# Patient Record
Sex: Male | Born: 1975 | Race: Black or African American | Hispanic: No | Marital: Single | Smoking: Former smoker
Health system: Southern US, Community
[De-identification: ages and names within clinical notes are randomized; demographics above are authoritative.]

## PROBLEM LIST (undated history)

## (undated) DIAGNOSIS — K219 Gastro-esophageal reflux disease without esophagitis: Secondary | ICD-10-CM

## (undated) DIAGNOSIS — Q6652 Congenital pes planus, left foot: Secondary | ICD-10-CM

## (undated) DIAGNOSIS — I10 Essential (primary) hypertension: Secondary | ICD-10-CM

## (undated) DIAGNOSIS — G5600 Carpal tunnel syndrome, unspecified upper limb: Secondary | ICD-10-CM

---

## 2020-07-05 DIAGNOSIS — S83249A Other tear of medial meniscus, current injury, unspecified knee, initial encounter: Secondary | ICD-10-CM | POA: Insufficient documentation

## 2020-09-15 ENCOUNTER — Other Ambulatory Visit: Payer: Self-pay

## 2020-09-15 ENCOUNTER — Emergency Department (HOSPITAL_COMMUNITY)
Admission: EM | Admit: 2020-09-15 | Discharge: 2020-09-15 | Disposition: A | Discharge status: Home / Self Care | Attending: Emergency Medicine | Admitting: Emergency Medicine

## 2020-09-15 ENCOUNTER — Encounter (HOSPITAL_COMMUNITY): Payer: Self-pay | Admitting: Emergency Medicine

## 2020-09-15 ENCOUNTER — Emergency Department (HOSPITAL_COMMUNITY)

## 2020-09-15 DIAGNOSIS — M25532 Pain in left wrist: Secondary | ICD-10-CM | POA: Insufficient documentation

## 2020-09-15 DIAGNOSIS — Z87891 Personal history of nicotine dependence: Secondary | ICD-10-CM | POA: Diagnosis not present

## 2020-09-15 MED ORDER — DICLOFENAC SODIUM 1 % EX GEL: 2.0000 g | Freq: Four times a day (QID) | CUTANEOUS | 0 refills | Status: DC

## 2020-09-15 NOTE — Discharge Instructions (Addendum)
Take Tylenol and Ibuprofen for pain.  May use Voltaren gel to wrist  Follow up with Orthopedics given symptoms symptoms for 9 months

## 2020-09-15 NOTE — ED Triage Notes (Signed)
Patient c/o left wrist pain x 9 months that is progressively getting worse. Denies any known injury or swelling. Denies taking anything for pain.

## 2020-09-15 NOTE — ED Notes (Signed)
Patient denies pain and is resting comfortably.  

## 2020-09-15 NOTE — ED Notes (Signed)
Wrist brace applied to left wrist. No complaints noted from pt

## 2020-09-15 NOTE — ED Provider Notes (Signed)
[facility] EMERGENCY DEPARTMENT Provider Note   CSN: [id number] Arrival date & time: 09/15/20  1304     History Chief Complaint  Patient presents with  . Wrist Pain    Erik Torres is a 44 y.o. male with no significant past medical history who presents for evaluation of wrist pain.  Patient states he has had left-sided wrist pain for last 9 months.  Constant in nature.  Worse with movement.  Does do repetitive motions.  He is currently incarcerated.  Denies fever, chills, nausea, vomiting, chest pain, shortness of breath, paresthesias, weakness, redness, swelling, warmth to extremities.  He has not take anything for his symptoms.  He denies additional aggravating or alleviating factors.  Rates his pain a 5/10.  Now chest pain, shortness of breath, hemoptysis.   History obtained from patient and past medical records.  No interpreter is used.  HPI     History reviewed. No pertinent past medical history.  There are no problems to display for this patient.   History reviewed. No pertinent surgical history.     History reviewed. No pertinent family history.  Social History   Tobacco Use  . Smoking status: Former Smoker    Packs/day: 0.50    Years: 10.00    Pack years: 5.00    Types: Cigarettes  . Smokeless tobacco: Never Used  Vaping Use  . Vaping Use: Never used  Substance Use Topics  . Alcohol use: Never  . Drug use: Never    Home Medications Prior to Admission medications   Medication Sig Start Date End Date Taking? Authorizing Provider  diclofenac Sodium (VOLTAREN) 1 % GEL Apply 2 g topically 4 (four) times daily. 09/15/20   Leyah Bocchino A, PA-C    Allergies    Patient has no known allergies.  Review of Systems   Review of Systems  Constitutional: Negative.   HENT: Negative.   Respiratory: Negative.   Cardiovascular: Negative.   Gastrointestinal: Negative.   Genitourinary: Negative.   Musculoskeletal:       Left wrist pain  Neurological:  Negative.   All other systems reviewed and are negative.  Physical Exam Updated Vital Signs BP (!) 179/102 (BP Location: Right Arm)   Pulse 64   Temp 98.4 F (36.9 C) (Oral)   Resp 17   Ht 5\' 9"  (1.753 m)   Wt 86.2 kg   SpO2 100%   BMI 28.06 kg/m   Physical Exam Vitals and nursing note reviewed.  Constitutional:      General: He is not in acute distress.    Appearance: He is well-developed. He is not ill-appearing, toxic-appearing or diaphoretic.  HENT:     Head: Normocephalic and atraumatic.     Nose: Nose normal.     Mouth/Throat:     Mouth: Mucous membranes are moist.  Eyes:     Pupils: Pupils are equal, round, and reactive to light.  Cardiovascular:     Rate and Rhythm: Normal rate and regular rhythm.     Pulses: Normal pulses.     Heart sounds: Normal heart sounds.  Pulmonary:     Effort: Pulmonary effort is normal. No respiratory distress.     Breath sounds: Normal breath sounds.  Abdominal:     General: Bowel sounds are normal. There is no distension.     Palpations: Abdomen is soft.     Tenderness: There is no abdominal tenderness. There is no right CVA tenderness, left CVA tenderness or guarding.  Musculoskeletal:  General: No swelling, tenderness, deformity or signs of injury. Normal range of motion.     Cervical back: Normal range of motion and neck supple.     Right lower leg: No edema.     Left lower leg: No edema.     Comments: Mild tenderness to left breast.  Able to flex and extend, pronate and supinate without difficulty.  No bony tenderness to scaphoid.  Equal handgrip bilaterally.  No bony tenderness to radius or ulna.  No overlying skin changes.  Skin:    General: Skin is warm and dry.     Capillary Refill: Capillary refill takes less than 2 seconds.     Comments: No edema, erythema or warmth.  No fluctuance or induration.  No rashes or lesions.  Neurological:     General: No focal deficit present.     Mental Status: He is alert and  oriented to person, place, and time.     Sensory: Sensation is intact.     Motor: Motor function is intact.     Coordination: Coordination is intact.     Gait: Gait is intact.     Comments: Cranial nerves II through XII grossly intact Intact sensation Equal handgrip bilaterally    ED Results / Procedures / Treatments   Labs (all labs ordered are listed, but only abnormal results are displayed) Labs Reviewed - No data to display  EKG None  Radiology DG Wrist Complete Left  Result Date: 09/15/2020 CLINICAL DATA:  Chronic left wrist pain without known injury. EXAM: LEFT WRIST - COMPLETE 3+ VIEW COMPARISON:  None. FINDINGS: There is no evidence of fracture or dislocation. There is no evidence of arthropathy or other focal bone abnormality. Soft tissues are unremarkable. IMPRESSION: Negative. Electronically Signed   By: Lupita Raider M.D.   On: 09/15/2020 14:00    Procedures Procedures (including critical care time)  Medications Ordered in ED Medications - No data to display  ED Course  I have reviewed the triage vital signs and the nursing notes.  Pertinent labs & imaging results that were available during my care of the patient were reviewed by me and considered in my medical decision making (see chart for details).  44 year old presents for evaluation of left wrist pain x1 month.  Does do repetitive motions.  He is currently incarcerated.  He is afebrile, not septic, not ill-appearing.  No bony tenderness.  Patient denies any IV drug use.  He has a normal musculoskeletal exam.  He is neurovascularly intact.  He has no overlying skin changes.  No edema, erythema, warmth.  No fluctuance or induration.  Plain film obtained from triage does not show evidence of fracture, dislocation or effusion.  Patient symptoms likely due to overuse.  Discussed Tylenol, ibuprofen as well as Voltaren gel.  He was given wrist splint for immobilization.  Discussed with patient rice for symptomatic  management.  He will follow-up with orthopedics if his symptoms are worse.  Low suspicion for septic joint, gout, hemarthrosis, VTE, myositis, fracture, dislocation, rhabdomyolysis, infectious process.  The patient has been appropriately medically screened and/or stabilized in the ED. I have low suspicion for any other emergent medical condition which would require further screening, evaluation or treatment in the ED or require inpatient management.  Patient is hemodynamically stable and in no acute distress.  Patient able to ambulate in department prior to ED.  Evaluation does not show acute pathology that would require ongoing or additional emergent interventions while in the emergency department  or further inpatient treatment.  I have discussed the diagnosis with the patient and answered all questions.  Pain is been managed while in the emergency department and patient has no further complaints prior to discharge.  Patient is comfortable with plan discussed in room and is stable for discharge at this time.  I have discussed strict return precautions for returning to the emergency department.  Patient was encouraged to follow-up with PCP/specialist refer to at discharge.   MDM Rules/Calculators/A&P                           Final Clinical Impression(s) / ED Diagnoses Final diagnoses:  Left wrist pain    Rx / DC Orders ED Discharge Orders         Ordered    diclofenac Sodium (VOLTAREN) 1 % GEL  4 times daily        09/15/20 1443           Anwitha Mapes A, PA-C 09/15/20 1449    Vanetta Mulders, MD 09/16/20 2207

## 2020-09-21 DIAGNOSIS — S62009A Unspecified fracture of navicular [scaphoid] bone of unspecified wrist, initial encounter for closed fracture: Secondary | ICD-10-CM | POA: Insufficient documentation

## 2021-08-15 ENCOUNTER — Ambulatory Visit

## 2021-08-15 ENCOUNTER — Ambulatory Visit (INDEPENDENT_AMBULATORY_CARE_PROVIDER_SITE_OTHER): Admitting: Orthopaedic Surgery

## 2021-08-15 ENCOUNTER — Encounter: Payer: Self-pay | Admitting: Orthopaedic Surgery

## 2021-08-15 ENCOUNTER — Other Ambulatory Visit: Payer: Self-pay

## 2021-08-15 VITALS — BP 158/92 | HR 54 | Ht 69.0 in | Wt 177.4 lb

## 2021-08-15 DIAGNOSIS — M25561 Pain in right knee: Secondary | ICD-10-CM | POA: Diagnosis not present

## 2021-08-15 DIAGNOSIS — G8929 Other chronic pain: Secondary | ICD-10-CM | POA: Diagnosis not present

## 2021-08-15 NOTE — Progress Notes (Signed)
Subjective:    Patient ID: Erik Torres, male    DOB: 09-Nov-1976, 45 y.o.   MRN: [id number]  HPI He is a prisoner at a local [facility] prison.  He has had pain in the right knee since a twisting injury in January.  He has had swelling and popping and giving way.  It is not improving. He had left knee meniscus surgery last year.  He was on crutches when this injury happened.  He has been taking ibuprofen.  His knee is not getting better. He has chronic swelling more medially  He has no new injury.  I have reviewed the papers from the prison.    Review of Systems  Constitutional:  Positive for activity change.  Musculoskeletal:  Positive for arthralgias, gait problem and joint swelling.  All other systems reviewed and are negative. For Review of Systems, all other systems reviewed and are negative.  The following is a summary of the past history medically, past history surgically, known current medicines, social history and family history.  This information is gathered electronically by the computer from prior information and documentation.  I review this each visit and have found including this information at this point in the chart is beneficial and informative.   History reviewed. No pertinent past medical history.  History reviewed. No pertinent surgical history.  Current Outpatient Medications on File Prior to Visit  Medication Sig Dispense Refill   diclofenac Sodium (VOLTAREN) 1 % GEL Apply 2 g topically 4 (four) times daily. 50 g 0   ibuprofen (ADVIL) 800 MG tablet Take 800 mg by mouth in the morning, at noon, and at bedtime.     No current facility-administered medications on file prior to visit.    Social History   Socioeconomic History   Marital status: Single    Spouse name: Not on file   Number of children: Not on file   Years of education: Not on file   Highest education level: Not on file  Occupational History   Not on file  Tobacco Use   Smoking status: Former     Packs/day: 0.50    Years: 10.00    Pack years: 5.00    Types: Cigarettes   Smokeless tobacco: Never  Vaping Use   Vaping Use: Never used  Substance and Sexual Activity   Alcohol use: Never   Drug use: Never   Sexual activity: Not on file  Other Topics Concern   Not on file  Social History Narrative   Not on file   Social Determinants of Health   Financial Resource Strain: Not on file  Food Insecurity: Not on file  Transportation Needs: Not on file  Physical Activity: Not on file  Stress: Not on file  Social Connections: Not on file  Intimate Partner Violence: Not on file    History reviewed. No pertinent family history.  BP (!) 158/92   Pulse (!) 54   Ht 5\' 9"  (1.753 m)   Wt 177 lb 6.4 oz (80.5 kg)   BMI 26.20 kg/m   Body mass index is 26.2 kg/m.     Objective:   Physical Exam Vitals and nursing note reviewed. Exam conducted with a chaperone present.  Constitutional:      Appearance: He is well-developed.  HENT:     Head: Normocephalic and atraumatic.  Eyes:     Conjunctiva/sclera: Conjunctivae normal.     Pupils: Pupils are equal, round, and reactive to light.  Cardiovascular:  Rate and Rhythm: Normal rate and regular rhythm.  Pulmonary:     Effort: Pulmonary effort is normal.  Abdominal:     Palpations: Abdomen is soft.  Musculoskeletal:     Cervical back: Normal range of motion and neck supple.       Legs:  Skin:    General: Skin is warm and dry.  Neurological:     Mental Status: He is alert and oriented to person, place, and time.     Cranial Nerves: No cranial nerve deficit.     Motor: No abnormal muscle tone.     Coordination: Coordination normal.     Deep Tendon Reflexes: Reflexes are normal and symmetric. Reflexes normal.  Psychiatric:        Behavior: Behavior normal.        Thought Content: Thought content normal.        Judgment: Judgment normal.  X-rays were done of the right knee, reported separately.        Assessment  & Plan:   Encounter Diagnosis  Name Primary?   Chronic pain of right knee Yes   I told him he may need MRI.  PROCEDURE NOTE:  The patient requests injections of the right knee , verbal consent was obtained.  The right knee was prepped appropriately after time out was performed.   Sterile technique was observed and injection of 1 cc of Celestone 6mg  with several cc's of plain xylocaine. Anesthesia was provided by ethyl chloride and a 20-gauge needle was used to inject the knee area. The injection was tolerated well.  A band aid dressing was applied.  The patient was advised to apply ice later today and tomorrow to the injection sight as needed.   Return in one month.  He may need MRI then.  Call if any problem.  Precautions discussed.  Electronically Signed , MD 9/27/202211:24 AM

## 2021-09-26 ENCOUNTER — Other Ambulatory Visit: Payer: Self-pay | Admitting: Orthopaedic Surgery

## 2021-09-26 ENCOUNTER — Ambulatory Visit (INDEPENDENT_AMBULATORY_CARE_PROVIDER_SITE_OTHER): Admitting: Orthopaedic Surgery

## 2021-09-26 ENCOUNTER — Other Ambulatory Visit: Payer: Self-pay

## 2021-09-26 ENCOUNTER — Encounter: Payer: Self-pay | Admitting: Orthopaedic Surgery

## 2021-09-26 VITALS — BP 136/81 | HR 59 | Ht 69.0 in | Wt 174.6 lb

## 2021-09-26 DIAGNOSIS — G8929 Other chronic pain: Secondary | ICD-10-CM

## 2021-09-26 DIAGNOSIS — M25561 Pain in right knee: Secondary | ICD-10-CM | POA: Diagnosis not present

## 2021-09-26 NOTE — Progress Notes (Signed)
My knee is still swelling.  The injection into the right knee last time helped a week or so but his pain is back.  He has swelling, popping and giving way now.  He has no locking.  He is taking ibuprofen.  He is a Presenter, broadcasting.  He has no new injury.  Right knee has effusion, ROM 0 to 110, some crepitus, positive medial [place], [place] intact.  Slight limp right.  Encounter Diagnosis  Name Primary?   Chronic pain of right knee Yes   I am concerned about medial meniscus tear of the right knee.  I will order MRI of the knee.  He may need arthroscopy of the knee.  Continue the ibuprofen.  Copy of this note to the prison to arrange MRI.  I will see in three weeks.  Delay return if MRI not completed.  I will need copy of the MRI results when he returns.  Call if any problem.  Precautions discussed.  Electronically Signed Darreld Mclean, MD 11/8/20221:46 PM

## 2021-12-05 ENCOUNTER — Ambulatory Visit (INDEPENDENT_AMBULATORY_CARE_PROVIDER_SITE_OTHER): Admitting: Orthopaedic Surgery

## 2021-12-05 ENCOUNTER — Encounter: Payer: Self-pay | Admitting: Orthopaedic Surgery

## 2021-12-05 ENCOUNTER — Other Ambulatory Visit: Payer: Self-pay

## 2021-12-05 DIAGNOSIS — G8929 Other chronic pain: Secondary | ICD-10-CM

## 2021-12-05 DIAGNOSIS — S83281A Other tear of lateral meniscus, current injury, right knee, initial encounter: Secondary | ICD-10-CM

## 2021-12-05 NOTE — Progress Notes (Signed)
My knee still hurts.  He has right knee pain, swelling, popping and giving way.  He had MRI which showed: Small horizontal undersurface tear involving the posterior horn of the lateral meniscus, free edge blunting and mild extrusion of the medial meniscal body without discrete tear, high grade cartilage loss along medial ridge of the patella, moderate size joint effusion synovitis, and large popliteal cyst with internal loose body.  I have gone over the results with him.  He is in the [facility] prison system as a prisoner.  I will send in the findings.  I recommend consideration of arthroscopy.  He is accompanied by a guard.  Right knee has effusion, pain, crepitus, ROM 0 to 110, slight limp right, medial and lateral joint pain, positive lateral [place], NV intact.  Encounter Diagnoses  Name Primary?   Tear of lateral meniscus of right knee, current, initial encounter Yes   Chronic pain of right knee    To [facility] system for further treatment, possible surgery.  Call if any problem.  Precautions discussed.  Electronically Signed Darreld Mclean, MD 1/17/20238:21 AM

## 2021-12-20 ENCOUNTER — Encounter: Payer: Self-pay | Admitting: Orthopedic Surgery

## 2021-12-20 ENCOUNTER — Ambulatory Visit (INDEPENDENT_AMBULATORY_CARE_PROVIDER_SITE_OTHER): Admitting: Orthopedic Surgery

## 2021-12-20 ENCOUNTER — Other Ambulatory Visit: Payer: Self-pay

## 2021-12-20 VITALS — BP 148/91 | HR 67 | Ht 69.0 in | Wt 174.0 lb

## 2021-12-20 DIAGNOSIS — M23300 Other meniscus derangements, unspecified lateral meniscus, right knee: Secondary | ICD-10-CM

## 2021-12-20 NOTE — Progress Notes (Signed)
Chief Complaint  Patient presents with   Knee Pain    Right/ surgical consult    46 year old male inmate recently had a left knee arthroscopy done by [facility] in [place].  He was recovering from his surgery and slipped and injured his right knee.  He had an MRI I do not have a report and I do not have the films  He complains of pain and swelling of the right knee.  The last note from Dr. Hilda Lias says he has a lateral meniscus tear and some free edge degeneration of his medial meniscus and a loose body in the posterior part of the joint with a large synovitis and effusion  That is confirmed today by examination with large joint effusion noted previously drained came back  He has lateral and medial joint line tenderness and his flexion is less than 90 degrees looks like he can bring the knee to full extension and there is no laxity in that position  Plan is to speak with the nurse at the prison system to determine what their postop care would be if the patient were to proceed with arthroscopic surgery on his right knee for lateral meniscectomy  And I will still need an MRI report and I will need to see the films prior to scheduling surgery

## 2022-01-29 ENCOUNTER — Telehealth: Payer: Self-pay | Admitting: Orthopedic Surgery

## 2022-01-29 NOTE — Telephone Encounter (Signed)
Please see message below  Thank you

## 2022-01-29 NOTE — Telephone Encounter (Signed)
Call received from Glennie Isle, medical department at [facility], [place] #[phone] requesting to schedule surgery, as discussed as recent office visit. States their schedule is available after 02/27/22 for scheduling.  ?

## 2022-01-30 NOTE — Telephone Encounter (Signed)
Spoke with Miss Hurdle at the facility. She will fax a copy of the MRI report. States the imaging was done at [facility] and she is unable to get [place] the images.  ?

## 2022-01-30 NOTE — Telephone Encounter (Signed)
He had an MRI I do not have a report and I do not have the films ? ?He complains of pain and swelling of the right knee.  The last note from Dr. Hilda Lias says he has a lateral meniscus tear and some free edge degeneration of his medial meniscus and a loose body in the posterior part of the joint with a large synovitis and effusio ? ?And I will still need an MRI report and I will need to see the films prior to scheduling surgery

## 2022-01-31 NOTE — Telephone Encounter (Signed)
Did you receive the copy of MRI from facility per Ms. Hurdle (placed in box 01/30/22) ?

## 2022-02-01 NOTE — Telephone Encounter (Signed)
Spoke with Passenger transport manager at [facility]. Advised her that per provider, he will not do surgery without being able to see the images. She states that she will have to ask management how to get a copy of the disc because this is something they normally don't do.  ?

## 2022-04-19 ENCOUNTER — Telehealth: Payer: Self-pay | Admitting: Orthopedic Surgery

## 2022-04-19 NOTE — Telephone Encounter (Signed)
Call received from the [facility] - per [facility], [phone], ext [phone] - relaying that the surgery has been approved. State they would like to schedule. Please advise.

## 2022-04-19 NOTE — Telephone Encounter (Signed)
Dr Aline Brochure stated he still need an MRI report and I will need to see the films prior to scheduling surgery  Have you seen the images and the report yet?

## 2022-04-19 NOTE — Telephone Encounter (Signed)
Dr Aline Brochure stated he still need an MRI report and I will need to see the films prior to scheduling surgery/ I called her to advise   I called Erik Torres and she said she spoke to [name] previously and we had the CD received and she will fax me the report  Erik Catalina do you have the CD or know where it may be?

## 2022-04-19 NOTE — Telephone Encounter (Signed)
No I have not

## 2022-04-19 NOTE — Telephone Encounter (Signed)
Fax [phone] Ms Hurdle needs note that we need to see patient for surgical consult I will fax to her

## 2022-04-19 NOTE — Telephone Encounter (Signed)
Kathie Rhodes I found the CD on Dr Jenetta Downer desk

## 2022-04-19 NOTE — Telephone Encounter (Signed)
MRI Lower Extremity Joint Right Wo Contrast  Anatomical Region Laterality Modality  Ankle right Magnetic Resonance  Knee -- --  Hip -- --   Impression  1. Small horizontal undersurface tear involving the posterior horn of the lateral meniscus.  2. Free edge blunting and mild extrusion of the medial meniscal body without discrete tear  3. High-grade cartilage loss along the median ridge of the patella.  4. Moderate size joint effusion synovitis  5. Large popliteal cyst with internal loose body. Narrative  EXAM: MRI LOWER EXTREMITY JOINT RIGHT WO CONTRAST  DATE: 11/23/2021 8:38 AM  ACCESSION: [id number]  DICTATED: 11/23/2021 8:56 AM  INTERPRETATION LOCATION: [facility]   CLINICAL INDICATION: 46 year old Male with right knee pain  - M25.561 - Right knee pain, unspecified chronicity     COMPARISON: None.   TECHNIQUE: MRI of the right knee was performed without contrast using a local coil.  Multisequence, multiplanar images were obtained.   FINDINGS:  Medial meniscus: Mild extrusion of the medial meniscus with free edge blunting. No discrete tear.   Lateral meniscus: Small focal horizontal undersurface tear involving the posterior horn of the lateral meniscus (4:17, 8:5)   Cruciate ligaments: Intact anterior and posterior cruciate ligaments.   Collateral ligaments: Mild thickening of the MCL without evidence of acute tear/sprain. Intact lateral collateral ligamentous complex.   Tendons: Quadriceps tendon is intact. Small ossicles near the patellar tendon insertion are favored chronic. The semimembranosus and popliteus tendons are intact.   Bones: No bone marrow edema, fracture or bone lesion.   Cartilage:  Patellofemoral compartment: High-grade to full-thickness cartilage fissuring/loss seen along the median ridge of the patella measuring approximately 14 x 13 mm.  Medial compartment: Intact  Lateral compartment: Intact   Joint:  No subluxation. Moderate to large joint  effusion with synovial thickening.   Musculature: No edema or fatty atrophy.   Additional: Large popliteal cyst with internal body measuring approximately 8 mm. Prepatellar soft tissue edema.  Procedure Note  Lyn Records, MD - 11/23/2021  Formatting of this note might be different from the original.  EXAM: MRI LOWER EXTREMITY JOINT RIGHT WO CONTRAST  DATE: 11/23/2021 8:38 AM  ACCESSION: [id number]  DICTATED: 11/23/2021 8:56 AM  INTERPRETATION LOCATION: [place]   CLINICAL INDICATION: 46 year old Male with right knee pain  - M25.561 - Right knee pain, unspecified chronicity     COMPARISON: None.   TECHNIQUE: MRI of the right knee was performed without contrast using a local coil.  Multisequence, multiplanar images were obtained.   FINDINGS:  Medial meniscus: Mild extrusion of the medial meniscus with free edge blunting. No discrete tear.   Lateral meniscus: Small focal horizontal undersurface tear involving the posterior horn of the lateral meniscus (4:17, 8:5)   Cruciate ligaments: Intact anterior and posterior cruciate ligaments.   Collateral ligaments: Mild thickening of the MCL without evidence of acute tear/sprain. Intact lateral collateral ligamentous complex.   Tendons: Quadriceps tendon is intact. Small ossicles near the patellar tendon insertion are favored chronic. The semimembranosus and popliteus tendons are intact.   Bones: No bone marrow edema, fracture or bone lesion.   Cartilage:  Patellofemoral compartment: High-grade to full-thickness cartilage fissuring/loss seen along the median ridge of the patella measuring approximately 14 x 13 mm.  Medial compartment: Intact  Lateral compartment: Intact   Joint:  No subluxation. Moderate to large joint effusion with synovial thickening.   Musculature: No edema or fatty atrophy.   Additional: Large popliteal cyst with internal  body measuring approximately 8 mm. Prepatellar soft tissue edema.   IMPRESSION:   1. Small horizontal undersurface tear involving the posterior horn of the lateral meniscus.  2. Free edge blunting and mild extrusion of the medial meniscal body without discrete tear  3. High-grade cartilage loss along the median ridge of the patella.  4. Moderate size joint effusion synovitis  5. Large popliteal cyst with internal loose body. Exam End: 11/23/21 08:38   Specimen Collected: 11/23/21 08:56 Last Resulted: 11/23/21 15:54  Received From: [place]  Result Received: 12/04/21 09:59   CD from [facility] was able to pull the report/ to Dr Aline Brochure

## 2022-04-24 NOTE — Telephone Encounter (Signed)
Still can't view them

## 2022-05-02 NOTE — Telephone Encounter (Signed)
Toniann Fail sent another request to [facility] for the images on PACS

## 2022-05-10 NOTE — Telephone Encounter (Signed)
Emailed them third time to see if the images are there to pull over.

## 2022-06-12 ENCOUNTER — Telehealth: Payer: Self-pay | Admitting: Orthopedic Surgery

## 2022-06-12 NOTE — Telephone Encounter (Signed)
What should I advise her I have the CD you wanted images on PACS Toniann Fail requested them in PACS 4 times but they are not there.

## 2022-06-12 NOTE — Telephone Encounter (Signed)
Please call Ms Erik Torres at [facility] - call received as to status of CD - said it has been mailed by [facility] to our office; states has not heard regarding surgery. Please call her (if possible by Thursday, 3:30pm as she will be out of office through rest of this week after that and next week. Ph [phone]

## 2022-07-03 NOTE — Telephone Encounter (Signed)
Ok I ll let u know when

## 2022-07-03 NOTE — Telephone Encounter (Signed)
Can u see if this inmate has had surgery yet ?

## 2022-07-26 ENCOUNTER — Telehealth: Payer: Self-pay | Admitting: Orthopedic Surgery

## 2022-07-26 NOTE — Telephone Encounter (Signed)
Call received from Miss Hurdle at [facility] facility/[place] [facility] - ph# [phone] - checking on status of surgery, or scheduling back to be seen in office, per last phone note 07/03/22. States that the patient has filed a Theatre manager with them. Please advise.

## 2022-07-26 NOTE — Telephone Encounter (Signed)
Schedule him an appointment on sep1 4th pre op

## 2022-07-27 NOTE — Telephone Encounter (Signed)
Done

## 2022-08-02 ENCOUNTER — Ambulatory Visit: Admitting: Orthopedic Surgery

## 2022-08-09 ENCOUNTER — Telehealth: Payer: Self-pay | Admitting: Radiology

## 2022-08-09 ENCOUNTER — Encounter: Payer: Self-pay | Admitting: Orthopedic Surgery

## 2022-08-09 ENCOUNTER — Ambulatory Visit (INDEPENDENT_AMBULATORY_CARE_PROVIDER_SITE_OTHER): Admitting: Orthopedic Surgery

## 2022-08-09 VITALS — BP 130/86 | HR 58 | Ht 69.0 in | Wt 174.0 lb

## 2022-08-09 DIAGNOSIS — M23300 Other meniscus derangements, unspecified lateral meniscus, right knee: Secondary | ICD-10-CM | POA: Diagnosis not present

## 2022-08-09 NOTE — Progress Notes (Addendum)
Reevaluation  Preop  Chief Complaint  Patient presents with   Knee Pain    Right/ discuss surgery MRI CD is here, in viewer     This is a 46 year old male who was in the prison system he has a horizontal undersurface tear of his lateral meniscus on MRI he was seen by Dr. Luna Glasgow referred to me for evaluation and management.  His surgery was delayed because we could not get the images  I have finally gotten the images and I do agree he has what appears to be a lateral meniscus tear.  He has a lot of swelling.  He has a a lot of motion loss in terms of flexion.  He is in pain but he is able to walk  Initial injury was as follows: He was recovering from his surgery and slipped and injured his right knee.   No Known Allergies   Review of Systems  Constitutional:  Negative for fever.  Respiratory:  Negative for shortness of breath.   Cardiovascular:  Negative for chest pain.  Gastrointestinal:  Negative for heartburn.  Genitourinary:  Negative for dysuria.  Neurological:  Negative for tingling.  Psychiatric/Behavioral:  The patient is not nervous/anxious.    History reviewed. No pertinent past medical history. Patient has had left knee arthroscopy in the past Social History   Tobacco Use   Smoking status: Former    Packs/day: 0.50    Years: 10.00    Total pack years: 5.00    Types: Cigarettes   Smokeless tobacco: Never  Vaping Use   Vaping Use: Never used  Substance Use Topics   Alcohol use: Never   Drug use: Never   Constitutional: General appearance normal  Cardiovascular no swelling or varicose veins good pulses normal temperature no edema no tenderness  Lymph nodes normal  Skin normal normal coordination  Neuro is normal reflexes are normal sensation is normal  Psych alert and oriented x 3, no depression or anxiety or agitation  Musculoskeletal gait he is in shackles so I cannot really tell  The right knee has tenderness on the lateral joint line large  effusion decreased range of motion he can only flex to 80 degrees he has pain with extension I can passively get him to -5 degrees.  The ligaments feel stable muscle tone and strength are normal  MRI shows torn meniscus lateral meniscus is a small tear surprised about the amount of fluid in the joint  In any event recommend arthroscopy of the right knee partial lateral meniscectomy  Low risk surgery  Please be advised: The procedure has been fully reviewed with the patient; The risks and benefits of surgery have been discussed and explained and understood. Alternative treatment has also been reviewed, questions were encouraged and answered. The postoperative plan is also been reviewed.

## 2022-08-09 NOTE — Patient Instructions (Addendum)
Meniscus Injury, Arthroscopy   Arthroscopy is a surgical procedure that involves the use of a small scope that has a camera and surgical instruments on the end (arthroscope). An arthroscope can be used to repair your meniscus injury.  LET [facility] CARE PROVIDER KNOW ABOUT: Any allergies you have. All medicines you are taking, including vitamins, herbs, eyedrops, creams, and over-the-counter medicines. Any recent colds or infections you have had or currently have. Previous problems you or members of your family have had with the use of anesthetics. Any blood disorders or blood clotting problems you have. Previous surgeries you have had. Medical conditions you have. RISKS AND COMPLICATIONS Generally, this is a safe procedure. However, as with any procedure, problems can occur. Possible problems include: Damage to nerves or blood vessels. Excess bleeding. Blood clots. Infection. BEFORE THE PROCEDURE Do not eat or drink for 6-8 hours before the procedure. Take medicines as directed by your surgeon. Ask your surgeon about changing or stopping your regular medicines. You may have lab tests the morning of surgery. PROCEDURE  You will be given one of the following:  A medicine that numbs the area (local anesthesia). A medicine that makes you go to sleep (general anesthesia). A medicine injected into your spine that numbs your body below the waist (spinal anesthesia). Most often, several small cuts (incisions) are made in the knee. The arthroscope and instruments go into the incisions to repair the damage. The torn portion of the meniscus is removed.   AFTER THE PROCEDURE You will be taken to the recovery area where your progress will be monitored. When you are awake, stable, and taking fluids without complications, you will be allowed to go home. This is usually the same day. A torn or stretched ligament (ligament sprain) may take 6-8 weeks to heal.  It takes about the 4-6 WEEKS if your  surgeon removed a torn meniscus. A repaired meniscus may require 6-12 weeks of recovery time. A torn ligament needing reconstructive surgery may take 6-12 months to heal fully.   This information is not intended to replace advice given to you by your health care provider. Make sure you discuss any questions you have with your health care provider. You have decided to proceed with operative arthroscopy of the knee. You have decided not to continue with nonoperative measures such as but not limited to oral medication, weight loss, activity modification, physical therapy, bracing, or injection.  We will perform operative arthroscopy of the knee. Some of the risks associated with arthroscopic surgery of the knee include but are not limited to Bleeding Infection Swelling Stiffness Blood clot Pain Need for knee replacement surgery    In compliance with recent [place] law in federal regulation regarding opioid use and abuse and addiction, we will taper (stop) opioid medication after 2 weeks.  If you're not comfortable with these risks and would like to continue with nonoperative treatment please let Dr. Aline Brochure know prior to your surgery. Your surgery will be at [facility] by Dr Aline Brochure / I will contact Ms Hurdle to schedule  The hospital will contact you with a preoperative appointment to discuss Anesthesia.  Please arrive on time or 15 minutes early for the preoperative appointment, they have a very tight schedule if you are late or do not come in your surgery will be cancelled.  The phone number is [phone]. Please bring your medications with you for the appointment. They will tell you the arrival time and medication instructions when you  have your preoperative evaluation. Do not wear nail polish the day of your surgery and if you take Phentermine you need to stop this medication ONE WEEK prior to your surgery. If you take Augustina Mood, Jardiance, or Steglatro) - Hold 72 hours  before the procedure.  If you take Ozempic,  Bydureon or Trulicity do not take for 8 days before your surgery. If you take Victoza, Rybelsis, Saxenda or Adlyxi stop 24 hours before the procedure.  Please arrive at the hospital 2 hours before procedure if scheduled at 9:30 or later in the day or at the time the nurse tells you at your preoperative visit.   If you have my chart do not use the time given in my chart use the time given to you by the nurse during your preoperative visit.   Your surgery  time may change. Please be available for phone calls the day of your surgery and the day before. The Short Stay department may need to discuss changes about your surgery time. Not reaching the you could lead to procedure delays and possible cancellation.  You must have a ride home and someone to stay with you for 24 to 48 hours. The person taking you home will receive and sign for the your discharge instructions.  Please be prepared to give your support person's name and telephone number to Central Registration. Dr Aline Brochure will need that name and phone number post procedure.

## 2022-08-09 NOTE — Telephone Encounter (Signed)
Call Erik Torres to schedule SARK lateral meniscectomy

## 2022-08-13 NOTE — Telephone Encounter (Signed)
Oct 24th I will put in orders and put in book

## 2022-08-15 ENCOUNTER — Other Ambulatory Visit: Payer: Self-pay | Admitting: Orthopedic Surgery

## 2022-08-15 DIAGNOSIS — M23300 Other meniscus derangements, unspecified lateral meniscus, right knee: Secondary | ICD-10-CM

## 2022-09-06 ENCOUNTER — Encounter (HOSPITAL_COMMUNITY): Payer: Self-pay

## 2022-09-07 ENCOUNTER — Encounter (HOSPITAL_COMMUNITY)
Admission: RE | Admit: 2022-09-07 | Discharge: 2022-09-07 | Disposition: A | Discharge status: Home / Self Care | Source: Ambulatory Visit | Attending: Orthopedic Surgery | Admitting: Orthopedic Surgery

## 2022-09-07 HISTORY — DX: Gastro-esophageal reflux disease without esophagitis: K21.9

## 2022-09-07 HISTORY — DX: Congenital pes planus, left foot: Q66.52

## 2022-09-07 HISTORY — DX: Essential (primary) hypertension: I10

## 2022-09-07 HISTORY — DX: Carpal tunnel syndrome, unspecified upper limb: G56.00

## 2022-09-10 NOTE — H&P (Signed)
  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 Luna Glasgow referred to me for evaluation and management.  His surgery was delayed because we could not get the images   I have finally gotten the images and I do agree he has what appears to be a lateral meniscus tear.  He has a lot of swelling.  He has a a lot of motion loss in terms of flexion.  He is in pain but he is able to walk   Initial injury was as follows: He was recovering from his surgery and slipped and injured his right knee.    No Known Allergies     Review of Systems  Constitutional:  Negative for fever.  Respiratory:  Negative for shortness of breath.   Cardiovascular:  Negative for chest pain.  Gastrointestinal:  Negative for heartburn.  Genitourinary:  Negative for dysuria.  Neurological:  Negative for tingling.  Psychiatric/Behavioral:  The patient is not nervous/anxious.     History reviewed. No pertinent past medical history.  No past surgical history on file.  Patient has had left knee arthroscopy in the past Social History         Tobacco Use   Smoking status: Former      Packs/day: 0.50      Years: 10.00      Total pack years: 5.00      Types: Cigarettes   Smokeless tobacco: Never  Vaping Use   Vaping Use: Never used  Substance Use Topics   Alcohol use: Never   Drug use: Never    Constitutional: General appearance normal  Cardiovascular no swelling or varicose veins good pulses normal temperature no edema no tenderness   Lymph nodes normal  Skin normal normal coordination  Neuro is normal reflexes are normal sensation is normal  Psych alert and oriented x 3, no depression or anxiety or agitation  Musculoskeletal gait he is in shackles so I cannot really tell   The  right knee has tenderness on the lateral joint line large effusion decreased range of motion he can only flex to 80 degrees he has pain with extension I can passively get him to -5 degrees.  The ligaments feel stable muscle tone and strength are normal  MRI shows torn meniscus lateral meniscus is a small tear surprised about the amount of fluid in the joint   In any event recommend arthroscopy of the right knee partial lateral meniscectomy   Low risk surgery   Please be advised: The procedure has been fully reviewed with the patient; The risks and benefits of surgery have been discussed and explained and understood. Alternative treatment has also been reviewed, questions were encouraged and answered. The postoperative plan is also been reviewed.

## 2022-09-11 ENCOUNTER — Encounter (HOSPITAL_COMMUNITY): Admission: RE | Payer: Self-pay | Source: Home / Self Care | Attending: Orthopedic Surgery

## 2022-09-11 ENCOUNTER — Ambulatory Visit (HOSPITAL_COMMUNITY): Admitting: Anesthesiology

## 2022-09-11 ENCOUNTER — Ambulatory Visit (HOSPITAL_COMMUNITY)
Admission: RE | Admit: 2022-09-11 | Discharge: 2022-09-11 | Attending: Orthopedic Surgery | Admitting: Orthopedic Surgery

## 2022-09-11 ENCOUNTER — Other Ambulatory Visit: Payer: Self-pay

## 2022-09-11 ENCOUNTER — Telehealth: Payer: Self-pay | Admitting: Orthopedic Surgery

## 2022-09-11 ENCOUNTER — Encounter (HOSPITAL_COMMUNITY): Payer: Self-pay | Admitting: Orthopedic Surgery

## 2022-09-11 ENCOUNTER — Ambulatory Visit (HOSPITAL_BASED_OUTPATIENT_CLINIC_OR_DEPARTMENT_OTHER): Admitting: Anesthesiology

## 2022-09-11 DIAGNOSIS — S83281A Other tear of lateral meniscus, current injury, right knee, initial encounter: Secondary | ICD-10-CM | POA: Diagnosis present

## 2022-09-11 DIAGNOSIS — M2241 Chondromalacia patellae, right knee: Secondary | ICD-10-CM

## 2022-09-11 DIAGNOSIS — M659 Synovitis and tenosynovitis, unspecified: Secondary | ICD-10-CM | POA: Insufficient documentation

## 2022-09-11 DIAGNOSIS — K219 Gastro-esophageal reflux disease without esophagitis: Secondary | ICD-10-CM | POA: Diagnosis not present

## 2022-09-11 DIAGNOSIS — I10 Essential (primary) hypertension: Secondary | ICD-10-CM | POA: Diagnosis not present

## 2022-09-11 DIAGNOSIS — M23300 Other meniscus derangements, unspecified lateral meniscus, right knee: Secondary | ICD-10-CM

## 2022-09-11 DIAGNOSIS — X58XXXA Exposure to other specified factors, initial encounter: Secondary | ICD-10-CM | POA: Diagnosis not present

## 2022-09-11 DIAGNOSIS — Z87891 Personal history of nicotine dependence: Secondary | ICD-10-CM | POA: Diagnosis not present

## 2022-09-11 DIAGNOSIS — M65969 Unspecified synovitis and tenosynovitis, unspecified lower leg: Secondary | ICD-10-CM

## 2022-09-11 HISTORY — PX: KNEE ARTHROSCOPY WITH LATERAL MENISECTOMY: SHX6193

## 2022-09-11 LAB — BASIC METABOLIC PANEL
Anion gap: 6 (ref 5–15)
BUN: 8 mg/dL (ref 6–20)
CO2: 31 mmol/L (ref 22–32)
Calcium: 9.3 mg/dL (ref 8.9–10.3)
Chloride: 101 mmol/L (ref 98–111)
Creatinine, Ser: 1.07 mg/dL (ref 0.61–1.24)
GFR, Estimated: >60 mL/min (ref >60)
Glucose, Bld: 93 mg/dL (ref 70–99)
Potassium: 3.3 mmol/L — ABNORMAL LOW (ref 3.5–5.1)
Sodium: 138 mmol/L (ref 135–145)

## 2022-09-11 LAB — CBC WITH DIFFERENTIAL/PLATELET
Abs Immature Granulocytes: 0.03 10*3/uL (ref 0.00–0.07)
Basophils Absolute: 0 10*3/uL (ref 0.0–0.1)
Basophils Relative: 1 %
Eosinophils Absolute: 0.1 10*3/uL (ref 0.0–0.5)
Eosinophils Relative: 2 %
HCT: 44.7 % (ref 39.0–52.0)
Hemoglobin: 14.8 g/dL (ref 13.0–17.0)
Immature Granulocytes: 1 %
Lymphocytes Relative: 34 %
Lymphs Abs: 2 10*3/uL (ref 0.7–4.0)
MCH: 28.9 pg (ref 26.0–34.0)
MCHC: 33.1 g/dL (ref 30.0–36.0)
MCV: 87.3 fL (ref 80.0–100.0)
Monocytes Absolute: 0.5 10*3/uL (ref 0.1–1.0)
Monocytes Relative: 9 %
Neutro Abs: 3.3 10*3/uL (ref 1.7–7.7)
Neutrophils Relative %: 53 %
Platelets: 308 10*3/uL (ref 150–400)
RBC: 5.12 MIL/uL (ref 4.22–5.81)
RDW: 12.4 % (ref 11.5–15.5)
WBC: 6.1 10*3/uL (ref 4.0–10.5)
nRBC: 0 % (ref 0.0–0.2)

## 2022-09-11 SURGERY — ARTHROSCOPY, KNEE, WITH LATERAL MENISCECTOMY
Anesthesia: General | Site: Knee | Laterality: Right

## 2022-09-11 MED ORDER — EPINEPHRINE PF 1 MG/ML IJ SOLN
INTRAMUSCULAR | Status: AC
  Filled 2022-09-11: qty 2

## 2022-09-11 MED ORDER — ONDANSETRON HCL 4 MG/2ML IJ SOLN
4.0000 mg | Freq: Once | INTRAMUSCULAR | Status: AC
  Administered 2022-09-11: 4 mg via INTRAVENOUS
  Filled 2022-09-11: qty 2

## 2022-09-11 MED ORDER — OXYCODONE HCL 5 MG PO TABS
5.0000 mg | ORAL_TABLET | Freq: Once | ORAL | Status: AC
  Administered 2022-09-11: 5 mg via ORAL
  Filled 2022-09-11: qty 1

## 2022-09-11 MED ORDER — EPHEDRINE 5 MG/ML INJ
INTRAVENOUS | Status: AC
  Filled 2022-09-11: qty 5

## 2022-09-11 MED ORDER — SODIUM CHLORIDE 0.9 % IR SOLN
Status: DC | PRN
  Administered 2022-09-11 (×7): 3000 mL

## 2022-09-11 MED ORDER — CEFAZOLIN SODIUM-DEXTROSE 2-4 GM/100ML-% IV SOLN
INTRAVENOUS | Status: AC
  Filled 2022-09-11: qty 100

## 2022-09-11 MED ORDER — PROPOFOL 10 MG/ML IV BOLUS
INTRAVENOUS | Status: AC
  Filled 2022-09-11: qty 20

## 2022-09-11 MED ORDER — IBUPROFEN 800 MG PO TABS: 800.0000 mg | ORAL_TABLET | Freq: Three times a day (TID) | ORAL | 1 refills | Status: AC | PRN

## 2022-09-11 MED ORDER — ONDANSETRON HCL 4 MG/2ML IJ SOLN
INTRAMUSCULAR | Status: DC | PRN
  Administered 2022-09-11: 4 mg via INTRAVENOUS

## 2022-09-11 MED ORDER — ONDANSETRON HCL 4 MG/2ML IJ SOLN: 4.0000 mg | Freq: Once | INTRAMUSCULAR | Status: DC | PRN

## 2022-09-11 MED ORDER — LIDOCAINE HCL (PF) 2 % IJ SOLN
INTRAMUSCULAR | Status: AC
  Filled 2022-09-11: qty 5

## 2022-09-11 MED ORDER — OXYCODONE HCL 5 MG PO TABS: 5.0000 mg | ORAL_TABLET | Freq: Once | ORAL | Status: DC | PRN

## 2022-09-11 MED ORDER — CEFAZOLIN SODIUM-DEXTROSE 2-4 GM/100ML-% IV SOLN
2.0000 g | INTRAVENOUS | Status: AC
  Administered 2022-09-11: 2 g via INTRAVENOUS

## 2022-09-11 MED ORDER — DEXAMETHASONE SODIUM PHOSPHATE 10 MG/ML IJ SOLN
INTRAMUSCULAR | Status: AC
  Filled 2022-09-11: qty 1

## 2022-09-11 MED ORDER — OXYCODONE HCL 5 MG/5ML PO SOLN: 5.0000 mg | Freq: Once | ORAL | Status: DC | PRN

## 2022-09-11 MED ORDER — METHYLPREDNISOLONE SODIUM SUCC 125 MG IJ SOLR
40.0000 mg | Freq: Once | INTRAMUSCULAR | Status: AC
  Administered 2022-09-11: 40 mg via INTRAVENOUS
  Filled 2022-09-11: qty 2

## 2022-09-11 MED ORDER — ONDANSETRON HCL 4 MG/2ML IJ SOLN
INTRAMUSCULAR | Status: AC
  Filled 2022-09-11: qty 2

## 2022-09-11 MED ORDER — MIDAZOLAM HCL 2 MG/2ML IJ SOLN
INTRAMUSCULAR | Status: AC
  Filled 2022-09-11: qty 2

## 2022-09-11 MED ORDER — LACTATED RINGERS IV SOLN: INTRAVENOUS | Status: DC

## 2022-09-11 MED ORDER — PROPOFOL 10 MG/ML IV BOLUS
INTRAVENOUS | Status: DC | PRN
  Administered 2022-09-11: 200 mg via INTRAVENOUS

## 2022-09-11 MED ORDER — MIDAZOLAM HCL 5 MG/5ML IJ SOLN
INTRAMUSCULAR | Status: DC | PRN
  Administered 2022-09-11: 2 mg via INTRAVENOUS

## 2022-09-11 MED ORDER — ACETAMINOPHEN 500 MG PO TABS: ORAL_TABLET | ORAL | 2 refills | Status: AC

## 2022-09-11 MED ORDER — ACETAMINOPHEN 500 MG PO TABS
500.0000 mg | ORAL_TABLET | Freq: Once | ORAL | Status: AC
  Administered 2022-09-11: 500 mg via ORAL
  Filled 2022-09-11: qty 1

## 2022-09-11 MED ORDER — BUPIVACAINE-EPINEPHRINE (PF) 0.5% -1:200000 IJ SOLN
INTRAMUSCULAR | Status: AC
  Filled 2022-09-11: qty 30

## 2022-09-11 MED ORDER — BUPIVACAINE-EPINEPHRINE (PF) 0.5% -1:200000 IJ SOLN
INTRAMUSCULAR | Status: DC | PRN
  Administered 2022-09-11: 30 mL via PERINEURAL

## 2022-09-11 MED ORDER — FENTANYL CITRATE (PF) 100 MCG/2ML IJ SOLN
INTRAMUSCULAR | Status: DC | PRN
  Administered 2022-09-11: 50 ug via INTRAVENOUS
  Administered 2022-09-11 (×2): 25 ug via INTRAVENOUS

## 2022-09-11 MED ORDER — LIDOCAINE HCL (CARDIAC) PF 100 MG/5ML IV SOSY
PREFILLED_SYRINGE | INTRAVENOUS | Status: DC | PRN
  Administered 2022-09-11: 100 mg via INTRATRACHEAL

## 2022-09-11 MED ORDER — ORAL CARE MOUTH RINSE: 15.0000 mL | Freq: Once | OROMUCOSAL | Status: AC

## 2022-09-11 MED ORDER — FENTANYL CITRATE PF 50 MCG/ML IJ SOSY
25.0000 ug | PREFILLED_SYRINGE | INTRAMUSCULAR | Status: DC | PRN
  Administered 2022-09-11: 50 ug via INTRAVENOUS
  Filled 2022-09-11: qty 1

## 2022-09-11 MED ORDER — EPHEDRINE SULFATE (PRESSORS) 50 MG/ML IJ SOLN
INTRAMUSCULAR | Status: DC | PRN
  Administered 2022-09-11: 10 mg via INTRAVENOUS
  Administered 2022-09-11 (×2): 7.5 mg via INTRAVENOUS

## 2022-09-11 MED ORDER — FENTANYL CITRATE (PF) 100 MCG/2ML IJ SOLN
INTRAMUSCULAR | Status: AC
  Filled 2022-09-11: qty 2

## 2022-09-11 MED ORDER — DEXAMETHASONE SODIUM PHOSPHATE 10 MG/ML IJ SOLN
INTRAMUSCULAR | Status: DC | PRN
  Administered 2022-09-11: 10 mg via INTRAVENOUS

## 2022-09-11 MED ORDER — CHLORHEXIDINE GLUCONATE 0.12 % MT SOLN
15.0000 mL | Freq: Once | OROMUCOSAL | Status: AC
  Administered 2022-09-11: 15 mL via OROMUCOSAL

## 2022-09-11 MED ORDER — PHENYLEPHRINE 80 MCG/ML (10ML) SYRINGE FOR IV PUSH (FOR BLOOD PRESSURE SUPPORT)
PREFILLED_SYRINGE | INTRAVENOUS | Status: AC
  Filled 2022-09-11: qty 20

## 2022-09-11 SURGICAL SUPPLY — 40 items
APL PRP STRL LF DISP 70% ISPRP (MISCELLANEOUS) ×2
BLADE SHAVER TORPEDO 4X13 (MISCELLANEOUS) IMPLANT
BNDG CMPR STD VLCR NS LF 5.8X6 (GAUZE/BANDAGES/DRESSINGS) ×1
BNDG ELASTIC 6X5.8 VLCR NS LF (GAUZE/BANDAGES/DRESSINGS) ×1 IMPLANT
CHLORAPREP W/TINT 26 (MISCELLANEOUS) ×2 IMPLANT
CLOTH BEACON ORANGE TIMEOUT ST (SAFETY) ×1 IMPLANT
COOLER ICEMAN CLASSIC (MISCELLANEOUS) ×1 IMPLANT
DECANTER SPIKE VIAL GLASS SM (MISCELLANEOUS) ×2 IMPLANT
EXCALIBUR CVD 4.0MM X 13CM (MISCELLANEOUS) IMPLANT
GAUZE SPONGE 4X4 12PLY STRL (GAUZE/BANDAGES/DRESSINGS) ×1 IMPLANT
GAUZE XEROFORM 5X9 LF (GAUZE/BANDAGES/DRESSINGS) ×1 IMPLANT
GLOVE BIOGEL PI IND STRL 7.0 (GLOVE) ×2 IMPLANT
GLOVE SS N UNI LF 8.5 STRL (GLOVE) ×1 IMPLANT
GLOVE SURG POLYISO LF SZ8 (GLOVE) ×1 IMPLANT
GLOVE SURG SS PI 7.0 STRL IVOR (GLOVE) IMPLANT
GOWN STRL REUS W/TWL LRG LVL3 (GOWN DISPOSABLE) ×1 IMPLANT
GOWN STRL REUS W/TWL XL LVL3 (GOWN DISPOSABLE) ×1 IMPLANT
IV NS IRRIG 3000ML ARTHROMATIC (IV SOLUTION) ×2 IMPLANT
KIT BLADEGUARD II DBL (SET/KITS/TRAYS/PACK) ×1 IMPLANT
KIT TURNOVER CYSTO (KITS) ×1 IMPLANT
MANIFOLD NEPTUNE II (INSTRUMENTS) ×1 IMPLANT
MARKER SKIN DUAL TIP RULER LAB (MISCELLANEOUS) ×1 IMPLANT
NDL HYPO 18GX1.5 BLUNT FILL (NEEDLE) ×1 IMPLANT
NDL HYPO 21X1.5 SAFETY (NEEDLE) ×1 IMPLANT
NEEDLE HYPO 18GX1.5 BLUNT FILL (NEEDLE) ×1 IMPLANT
NEEDLE HYPO 21X1.5 SAFETY (NEEDLE) ×1 IMPLANT
PACK ARTHRO LIMB DRAPE STRL (MISCELLANEOUS) ×1 IMPLANT
PAD ABD 5X9 TENDERSORB (GAUZE/BANDAGES/DRESSINGS) ×1 IMPLANT
PAD ARMBOARD 7.5X6 YLW CONV (MISCELLANEOUS) ×1 IMPLANT
PAD CAST 4YDX4 CTTN HI CHSV (CAST SUPPLIES) IMPLANT
PAD COLD SHLDR SM WRAP-ON (PAD) IMPLANT
PAD FOR LEG HOLDER (MISCELLANEOUS) ×1 IMPLANT
PADDING CAST COTTON 4X4 STRL (CAST SUPPLIES) ×1
PORT APPOLLO RF 90DEGREE MULTI (SURGICAL WAND) IMPLANT
SET ARTHROSCOPY INST (INSTRUMENTS) ×1 IMPLANT
SET BASIN LINEN APH (SET/KITS/TRAYS/PACK) ×1 IMPLANT
SUT ETHILON 3 0 FSL (SUTURE) ×1 IMPLANT
SYR 30ML LL (SYRINGE) ×1 IMPLANT
TUBE CONNECTING 12X1/4 (SUCTIONS) ×2 IMPLANT
TUBING IN/OUT FLOW W/MAIN PUMP (TUBING) ×1 IMPLANT

## 2022-09-11 NOTE — Progress Notes (Signed)
Report called to correctional facility to Ms Sharlet Salina, RN. Prior to pt transport back to facility

## 2022-09-11 NOTE — Interval H&P Note (Signed)
History and Physical Interval Note:  09/11/2022 8:52 AM  Erik Torres  has presented today for surgery, with the diagnosis of RIGHT KNEE LATERAL MENISCECTOMY.  The various methods of treatment have been discussed with the patient and family. After consideration of risks, benefits and other options for treatment, the patient has consented to  Procedure(s): KNEE ARTHROSCOPY WITH LATERAL MENISECTOMY (Right) as a surgical intervention.  The patient's history has been reviewed, patient examined, no change in status, stable for surgery.  I have reviewed the patient's chart and labs.  Questions were answered to the patient's satisfaction.     Arther Abbott

## 2022-09-11 NOTE — Anesthesia Postprocedure Evaluation (Signed)
Anesthesia Post Note  Patient: Erik Torres  Procedure(s) Performed: KNEE ARTHROSCOPY WITH THREE COMPARTMENT SYNOVECTOMY (Right: Knee)  Patient location during evaluation: PACU Anesthesia Type: General Level of consciousness: awake and alert Pain management: pain level controlled Vital Signs Assessment: post-procedure vital signs reviewed and stable Respiratory status: spontaneous breathing Cardiovascular status: blood pressure returned to baseline and stable Postop Assessment: no apparent nausea or vomiting Anesthetic complications: no   No notable events documented.   Last Vitals:  Vitals:   09/11/22 1100 09/11/22 1123  BP: (!) 150/100 (!) 148/96  Pulse: 65 69  Resp: 13 14  Temp:  36.6 C  SpO2: 100% 100%    Last Pain:  Vitals:   09/11/22 1123  TempSrc: Oral  PainSc: 5                  Reason Helzer

## 2022-09-11 NOTE — Anesthesia Preprocedure Evaluation (Signed)
Anesthesia Evaluation  Patient identified by MRN, date of birth, ID band Patient awake    Reviewed: Allergy & Precautions, H&P , NPO status , Patient's Chart, lab work & pertinent test results, reviewed documented beta blocker date and time   Airway Mallampati: II  TM Distance: >3 FB Neck ROM: full    Dental no notable dental hx.    Pulmonary neg pulmonary ROS, former smoker,    Pulmonary exam normal breath sounds clear to auscultation       Cardiovascular Exercise Tolerance: Good hypertension, negative cardio ROS   Rhythm:regular Rate:Normal     Neuro/Psych  Neuromuscular disease negative psych ROS   GI/Hepatic Neg liver ROS, GERD  Medicated,  Endo/Other  negative endocrine ROS  Renal/GU negative Renal ROS  negative genitourinary   Musculoskeletal   Abdominal   Peds  Hematology negative hematology ROS (+)   Anesthesia Other Findings   Reproductive/Obstetrics negative OB ROS                             Anesthesia Physical Anesthesia Plan  ASA: 2  Anesthesia Plan: General and General LMA   Post-op Pain Management:    Induction:   PONV Risk Score and Plan: Ondansetron  Airway Management Planned:   Additional Equipment:   Intra-op Plan:   Post-operative Plan:   Informed Consent: I have reviewed the patients History and Physical, chart, labs and discussed the procedure including the risks, benefits and alternatives for the proposed anesthesia with the patient or authorized representative who has indicated his/her understanding and acceptance.     Dental Advisory Given  Plan Discussed with: CRNA  Anesthesia Plan Comments:         Anesthesia Quick Evaluation

## 2022-09-11 NOTE — Op Note (Signed)
09/11/2022  10:18 AM  PATIENT:  Erik Torres  46 y.o. 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  There was no evidence of torn lateral meniscus.  There was some fraying of the free edge of the body of the meniscus laterally but no tear was found.  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  Chart review was completed patient was taken to surgery for general anesthesia.  He was in the supine position with the arthroscopic leg holder on the right and a padded leg holder on the left.  A brief exam under anesthesia was performed and the patient was prepped and draped  After sterile prep and drape timeout was completed.  A lateral portal was made and the scope was placed into the joint the menisci and ACL and PCL as well as patellofemoral joint were evaluated and there was no obvious tear.  We did see extensive synovitis in all 3 compartments  The trochlea was severely worn as was the patella  With the medial portal we placed a probe into the joint and repeated the diagnostic arthroscopy and no new findings were noted  With a combination of multiple shavers and arthroscopic wand we did a 3 compartment synovectomy.  We took extensive care to confirm that there was no lateral meniscal tear  The joint was irrigated suctioned dry injected with Marcaine with epinephrine portals closed with 3-0 nylon suture  Postop plan  Standard postarthroscopy plan  with  Weightbearing as tolerated Ice Tylenol with anti-inflammatories (ibuprofen for pain) Return in approximately 1 week  SURGEON:  Surgeon(s) and Role:    * Carole Civil, MD - Primary  PHYSICIAN ASSISTANT:   ASSISTANTS: none   ANESTHESIA:   general  EBL:  5 mL   BLOOD ADMINISTERED:none  DRAINS: none   LOCAL MEDICATIONS USED:  MARCAINE     SPECIMEN:  No Specimen  DISPOSITION OF SPECIMEN:  N/A  COUNTS:  YES  TOURNIQUET:  * No tourniquets in log *  DICTATION: .Dragon Dictation  PLAN OF CARE: Discharge to home after PACU  PATIENT DISPOSITION:  PACU - hemodynamically stable.   Delay start of Pharmacological VTE agent (>24hrs) due to surgical blood loss or risk of bleeding: not applicable

## 2022-09-11 NOTE — Telephone Encounter (Signed)
-----   Message from Carole Civil, MD sent at 09/11/2022 10:29 AM EDT ----- November 1 follow-up postop visit #1 knee arthroscopy

## 2022-09-11 NOTE — Telephone Encounter (Signed)
[facility] facility to schedule appointment per Dr Ruthe Mannan staff message. Left message for return call.

## 2022-09-11 NOTE — Transfer of Care (Signed)
Immediate Anesthesia Transfer of Care Note  Patient: Erik Torres  Procedure(s) Performed: KNEE ARTHROSCOPY WITH THREE COMPARTMENT SYNOVECTOMY (Right: Knee)  Patient Location: PACU  Anesthesia Type:General  Level of Consciousness: sedated  Airway & Oxygen Therapy: Patient Spontanous Breathing and Patient connected to face mask oxygen  Post-op Assessment: Report given to RN  Post vital signs: Reviewed and stable  Last Vitals:  Vitals Value Taken Time  BP 114/69 09/11/22 1022  Temp    Pulse 55 09/11/22 1025  Resp 16 09/11/22 1025  SpO2 100 % 09/11/22 1025  Vitals shown include unvalidated device data.  Last Pain:  Vitals:   09/11/22 0751  TempSrc: Oral  PainSc: 3       Patients Stated Pain Goal: 6 (90/24/09 7353)  Complications: No notable events documented.

## 2022-09-11 NOTE — Anesthesia Procedure Notes (Signed)
Procedure Name: LMA Insertion Date/Time: 09/11/2022 9:06 AM  Performed by: Camillia Herter, RNPre-anesthesia Checklist: Patient identified, Emergency Drugs available, Suction available and Patient being monitored Patient Re-evaluated:Patient Re-evaluated prior to induction Oxygen Delivery Method: Circle system utilized Preoxygenation: Pre-oxygenation with 100% oxygen Induction Type: IV induction Ventilation: Mask ventilation without difficulty LMA: LMA inserted LMA Size: 4.0 Number of attempts: 1 Placement Confirmation: positive ETCO2 and breath sounds checked- equal and bilateral Tube secured with: Tape Dental Injury: Teeth and Oropharynx as per pre-operative assessment

## 2022-09-11 NOTE — Brief Op Note (Signed)
09/11/2022  10:18 AM  PATIENT:  Erik Torres  46 y.o. 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   Delay start of Pharmacological VTE agent (>24hrs) due to surgical blood loss or risk of bleeding: not applicable

## 2022-09-12 NOTE — Telephone Encounter (Signed)
[facility] facility; scheduled appointment per phone with Erik Torres.

## 2022-09-14 ENCOUNTER — Encounter (HOSPITAL_COMMUNITY): Payer: Self-pay | Admitting: Orthopedic Surgery

## 2022-09-18 ENCOUNTER — Encounter: Admitting: Orthopedic Surgery

## 2022-09-21 ENCOUNTER — Ambulatory Visit (INDEPENDENT_AMBULATORY_CARE_PROVIDER_SITE_OTHER): Admitting: Orthopedic Surgery

## 2022-09-21 ENCOUNTER — Encounter: Payer: Self-pay | Admitting: Orthopedic Surgery

## 2022-09-21 DIAGNOSIS — Z9889 Other specified postprocedural states: Secondary | ICD-10-CM

## 2022-09-21 NOTE — Progress Notes (Signed)
POST OP VISIT   Encounter Diagnosis  Name Primary?   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  There was no evidence of torn lateral meniscus.  There was some fraying of the free edge of the body of the meniscus laterally but no tear was found.   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

## 2022-10-19 IMAGING — DX DG WRIST COMPLETE 3+V*L*
4 series · 4 of 4 positions shown · non-contrast
Comparison: None.

CLINICAL DATA: Chronic left wrist pain without known injury.

EXAM:
LEFT WRIST - COMPLETE 3+ VIEW

[wrist pa]
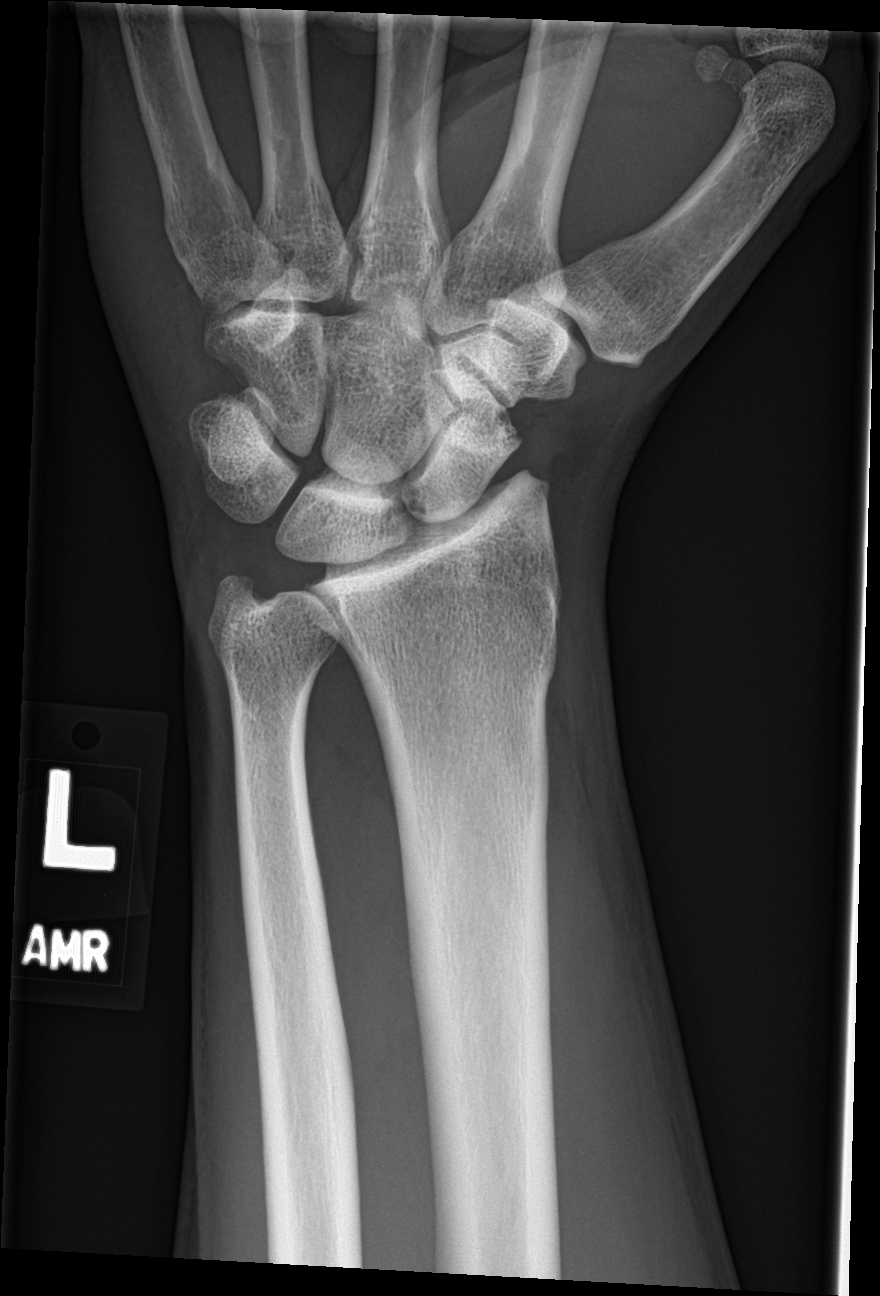

[wrist navicular]
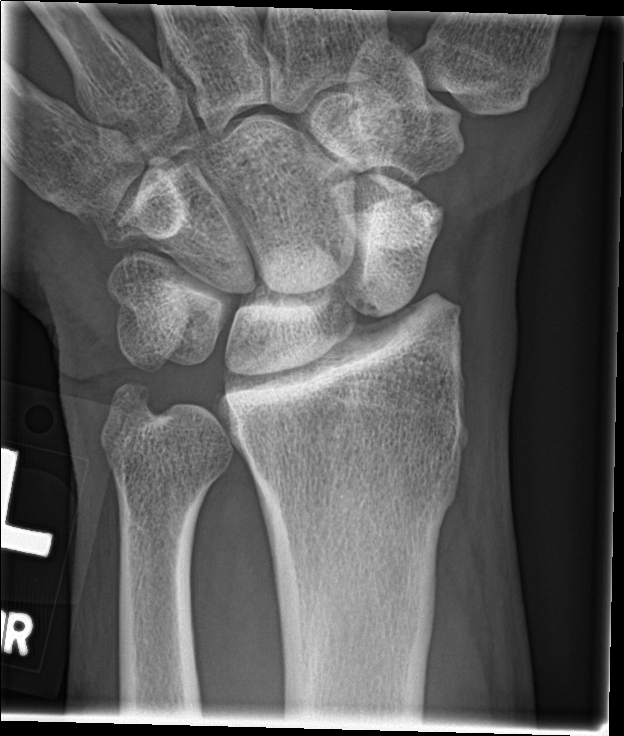

[wrist obl]
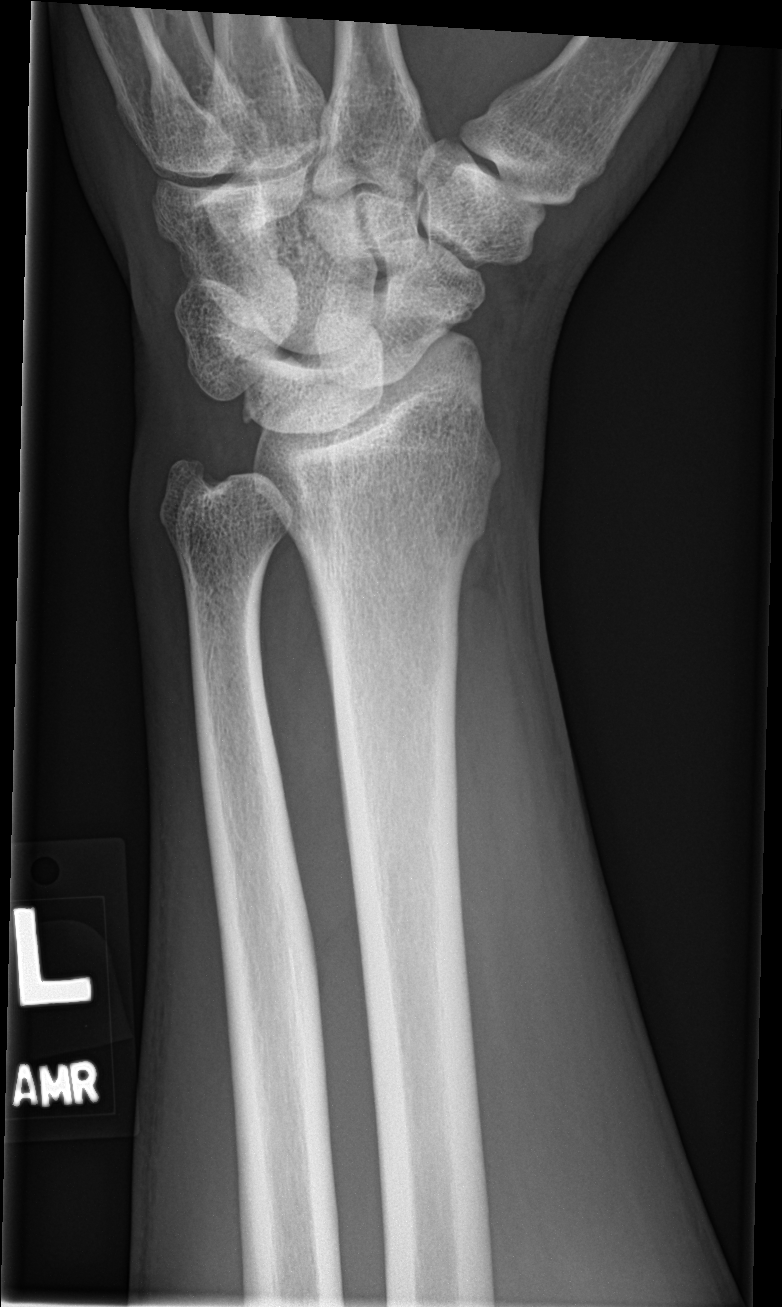

[wrist lat]
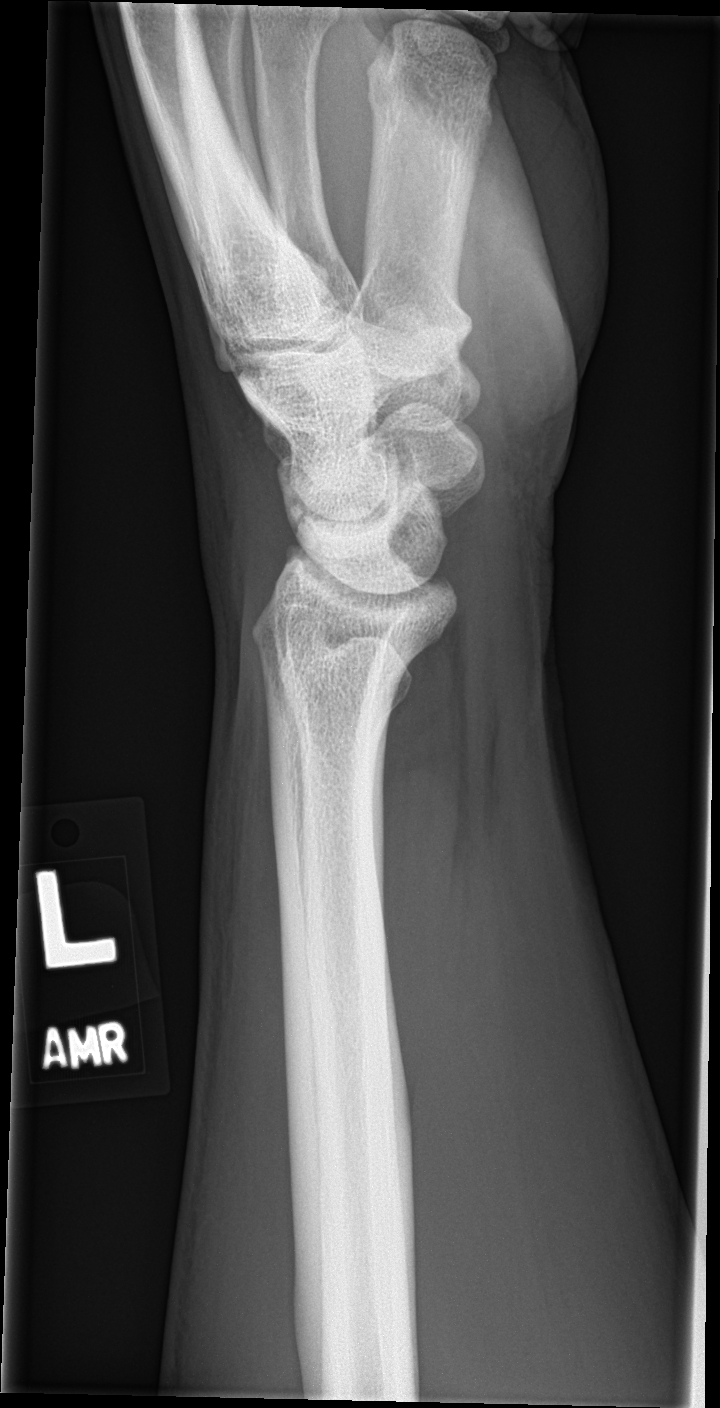

[4 of 4 positions shown; findings below may reference images not displayed]

FINDINGS: There is no evidence of fracture or dislocation. There is no
evidence of arthropathy or other focal bone abnormality. Soft
tissues are unremarkable.
IMPRESSION: Negative.

## 2023-09-18 ENCOUNTER — Encounter: Payer: Self-pay | Admitting: Orthopedic Surgery

## 2024-02-07 ENCOUNTER — Other Ambulatory Visit (HOSPITAL_COMMUNITY): Payer: Self-pay
